# Patient Record
Sex: Female | Born: 2000
Health system: Southern US, Community
[De-identification: ages and names within clinical notes are randomized; demographics above are authoritative.]

## PROBLEM LIST (undated history)

## (undated) DIAGNOSIS — J45909 Unspecified asthma, uncomplicated: Secondary | ICD-10-CM

## (undated) DIAGNOSIS — F32A Depression, unspecified: Secondary | ICD-10-CM

## (undated) DIAGNOSIS — F419 Anxiety disorder, unspecified: Secondary | ICD-10-CM

## (undated) DIAGNOSIS — T7840XA Allergy, unspecified, initial encounter: Secondary | ICD-10-CM

## (undated) HISTORY — DX: Unspecified asthma, uncomplicated: J45.909

## (undated) HISTORY — DX: Depression, unspecified: F32.A

## (undated) HISTORY — DX: Anxiety disorder, unspecified: F41.9

## (undated) HISTORY — DX: Allergy, unspecified, initial encounter: T78.40XA

---
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
## 2019-12-01 ENCOUNTER — Other Ambulatory Visit: Payer: Self-pay

## 2019-12-01 ENCOUNTER — Inpatient Hospital Stay: Payer: No Typology Code available for payment source | Attending: Hematology and Oncology

## 2019-12-01 DIAGNOSIS — Z23 Encounter for immunization: Secondary | ICD-10-CM | POA: Diagnosis not present

## 2019-12-22 ENCOUNTER — Inpatient Hospital Stay: Payer: No Typology Code available for payment source

## 2019-12-25 ENCOUNTER — Other Ambulatory Visit: Payer: Self-pay

## 2019-12-25 ENCOUNTER — Inpatient Hospital Stay: Payer: No Typology Code available for payment source

## 2019-12-25 DIAGNOSIS — Z23 Encounter for immunization: Secondary | ICD-10-CM | POA: Diagnosis not present

## 2019-12-25 NOTE — Progress Notes (Signed)
   Covid-19 Vaccination Clinic  Name:  Curry Dulski    MRN: [id number] DOB: 22-Jan-2001  12/25/2019  Ms. Worrall was observed post Covid-19 immunization for 15 minutes without incident. She was provided with Vaccine Information Sheet and instruction to access the V-Safe system.   Ms. Hedger was instructed to call 911 with any severe reactions post vaccine: Marland Kitchen Difficulty breathing  . Swelling of face and throat  . A fast heartbeat  . A bad rash all over body  . Dizziness and weakness

## 2020-08-08 ENCOUNTER — Encounter: Payer: Self-pay | Admitting: Nurse Practitioner

## 2020-09-05 ENCOUNTER — Other Ambulatory Visit: Payer: Self-pay

## 2020-09-06 ENCOUNTER — Encounter: Payer: Self-pay | Admitting: Nurse Practitioner

## 2020-09-06 ENCOUNTER — Other Ambulatory Visit (INDEPENDENT_AMBULATORY_CARE_PROVIDER_SITE_OTHER): Payer: No Typology Code available for payment source

## 2020-09-06 ENCOUNTER — Ambulatory Visit (INDEPENDENT_AMBULATORY_CARE_PROVIDER_SITE_OTHER): Payer: No Typology Code available for payment source | Admitting: Nurse Practitioner

## 2020-09-06 VITALS — BP 110/60 | HR 79 | Ht 61.0 in | Wt 115.0 lb

## 2020-09-06 DIAGNOSIS — R1032 Left lower quadrant pain: Secondary | ICD-10-CM

## 2020-09-06 DIAGNOSIS — K5900 Constipation, unspecified: Secondary | ICD-10-CM

## 2020-09-06 DIAGNOSIS — R140 Abdominal distension (gaseous): Secondary | ICD-10-CM | POA: Diagnosis not present

## 2020-09-06 LAB — CBC WITH DIFFERENTIAL/PLATELET
Basophils Absolute: 0 10*3/uL (ref 0.0–0.1)
Basophils Relative: 0.4 % (ref 0.0–3.0)
Eosinophils Absolute: 0.2 10*3/uL (ref 0.0–0.7)
Eosinophils Relative: 2.6 % (ref 0.0–5.0)
HCT: 38.9 % (ref 36.0–46.0)
Hemoglobin: 12.7 g/dL (ref 12.0–15.0)
Lymphocytes Relative: 50.1 % — ABNORMAL HIGH (ref 12.0–46.0)
Lymphs Abs: 2.9 10*3/uL (ref 0.7–4.0)
MCHC: 32.7 g/dL (ref 30.0–36.0)
MCV: 83.4 fl (ref 78.0–100.0)
Monocytes Absolute: 0.4 10*3/uL (ref 0.1–1.0)
Monocytes Relative: 7.3 % (ref 3.0–12.0)
Neutro Abs: 2.3 10*3/uL (ref 1.4–7.7)
Neutrophils Relative %: 39.6 % — ABNORMAL LOW (ref 43.0–77.0)
Platelets: 331 10*3/uL (ref 150.0–400.0)
RBC: 4.66 Mil/uL (ref 3.87–5.11)
RDW: 13.5 % (ref 11.5–15.5)
WBC: 5.9 10*3/uL (ref 4.0–10.5)

## 2020-09-06 LAB — COMPREHENSIVE METABOLIC PANEL
ALT: 52 U/L — ABNORMAL HIGH (ref 0–35)
AST: 31 U/L (ref 0–37)
Albumin: 4.9 g/dL (ref 3.5–5.2)
Alkaline Phosphatase: 56 U/L (ref 39–117)
BUN: 9 mg/dL (ref 6–23)
CO2: 23 mEq/L (ref 19–32)
Calcium: 9.7 mg/dL (ref 8.4–10.5)
Chloride: 102 mEq/L (ref 96–112)
Creatinine, Ser: 0.67 mg/dL (ref 0.40–1.20)
GFR: 125.13 mL/min (ref >60.00)
Glucose, Bld: 73 mg/dL (ref 70–99)
Potassium: 4.1 mEq/L (ref 3.5–5.1)
Sodium: 137 mEq/L (ref 135–145)
Total Bilirubin: 0.9 mg/dL (ref 0.2–1.2)
Total Protein: 7.9 g/dL (ref 6.0–8.3)

## 2020-09-06 LAB — TSH: TSH: 2.51 u[IU]/mL (ref 0.35–5.50)

## 2020-09-06 LAB — C-REACTIVE PROTEIN: CRP: <1 mg/dL (ref 0.5–20.0)

## 2020-09-06 NOTE — Progress Notes (Signed)
Addendum: Reviewed and agree with assessment and management plan. Sarye Kath M, MD  

## 2020-09-06 NOTE — Progress Notes (Signed)
09/06/2020 Tara Davies [id number] [birth year]   CHIEF COMPLAINT: LLQ abdominal pain and constipation   HISTORY OF PRESENT ILLNESS: Tara Davies is a 20 year old female with a history of Covid 2020 with post infectious asthma, anxiety and depression. No past surgical history. She presents to our office today as referred by Quentin Mulling gyn NP for further evaluation for LLQ abdominal pain, constipation and abdominal bloat which initially started 6 months ago.  She is accompanied by her mother.  She complains of having constipation with frequent abdominal bloat. She describes having abdominal bloat after eating most foods, abdomen feels rock hard. No specific food triggers. She reported passing a large formed hard brown stool or clumps of stool once weekly. She eats small snack size meals throughout the day. She was seen by her gynecologist NP who ordered a pelvic sonogram which showed a trace amount of free fluid within the pelvis presumably physiologic otherwise was negative.  She was prescribed Miralax QD which resulted in passing more frequent formed stools. Her constipation significantly improved so she stopped taking Miralax one week ago and she continues to pass a normal BM daily. No rectal bleeding or black stool. Her LLQ pain has diminished but she continues to have generalized abdominal bloat discomfort despite having normal bowel movements for the past week.  She was previously on Lexapro for anxiety and depression which was changed to Zoloft 1 week ago.  No known family history of celiac disease or IBD.  Pelvic sonogram 05/25/2020: Uterus Measurements: 6.0 x 2.4 x 3.9 cm = volume: 29.7 mL. Uterus is  anteverted. No discrete fibroid or other mass.   Endometrium Thickness: 3.0 mm.  No focal abnormality visualized.   Right ovary Measurements: 2.5 x 2.0 x 2.3 cm = volume: 6.1 mL. Normal  appearance/no adnexal mass.   Left ovary Measurements: 2.2 x 1.6 x 1.9 cm = volume: 3.4 mL. Normal   appearance/no adnexal mass.   Other findings Trace free fluid within the pelvis, presumably physiologic  Social History: She is single.  She is a Theatre stage manager.  Smoker.  No alcohol use.  No drug use.  Family History: Mom 50 healthy. Father 38 hypertension. Sister age 1 with anxiety. Maternal grandmother with IBS.    Outpatient Encounter Medications as of 09/06/2020  Medication Sig   cetirizine (ZYRTEC) 10 MG tablet Take by mouth.   escitalopram (LEXAPRO) 10 MG tablet Take 1 tablet by mouth daily.   LO LOESTRIN FE 1 MG-10 MCG / 10 MCG tablet Take 1 tablet by mouth daily.   sertraline (ZOLOFT) 50 MG tablet Take 50 mg by mouth daily.   No facility-administered encounter medications on file as of 09/06/2020.    REVIEW OF SYSTEMS:  Gen: Denies fever, sweats or chills. No weight loss.  CV: Denies chest pain, palpitations or edema. Resp: Denies cough, shortness of breath of hemoptysis.  GI: See HPI. GU : Denies urinary burning, blood in urine, increased urinary frequency or incontinence. MS: Denies joint pain, muscles aches or weakness. Derm: Denies rash, itchiness, skin lesions or unhealing ulcers. Psych: See HPI.  Heme: Denies bruising, bleeding. Neuro:  Denies headaches, dizziness or paresthesias. Endo:  Denies any problems with DM, thyroid or adrenal function.  PHYSICAL EXAM: BP 110/60   Pulse 79   Ht 5\' 1"  (1.549 m)   Wt 115 lb (52.2 kg)   BMI 21.73 kg/m   General: 20 year old female in no acute distress. Head: Normocephalic and atraumatic. Eyes:  Sclerae non-icteric, conjunctive pink. Ears: Normal auditory acuity. Mouth: Dentition intact. No ulcers or lesions.  Neck: Supple, no lymphadenopathy or thyromegaly.  Lungs: Clear bilaterally to auscultation without wheezes, crackles or rhonchi. Heart: Regular rate and rhythm. No murmur, rub or gallop appreciated.  Abdomen: Soft, non distended. Mild epigastric, RUQ, periumbilical and LLQ tenderness without rebound or guarding.  No masses. No hepatosplenomegaly. Normoactive bowel sounds x 4 quadrants.  Rectal: Deferred. Musculoskeletal: Symmetrical with no gross deformities. Skin: Warm and dry. No rash or lesions on visible extremities. Extremities: No edema. Neurological: Alert oriented x 4, no focal deficits.  Psychological:  Alert and cooperative. Normal mood and affect.  ASSESSMENT AND PLAN:  20 year old female with LLQ pain, constipation and abdominal bloat x 6 months. Constipation resolved after taking Miralax QD and after Lexapro was switched to Zoloft. Abdominal bloat discomfort persists despite resolution of constipation.  -CBC, CMP, TSH, CRP, TTG, IgA and food allergy panel -MiraLAX nightly as needed -Florastor probiotic 1 p.o. twice daily for 2 to 4-week -May add bacteria probiotic daily if needed in 1 week -IBgard 1 p.o. twice daily -Consider CTAP if her abdominal pains worsen -SIBO breath test if the above lab results unrevealing  -Avoid dairy products -Patient to follow-up in the office in 6-8 weeks       CC:  No ref. provider found

## 2020-09-06 NOTE — Patient Instructions (Addendum)
LABS:  Lab work has been ordered for you today. Our lab is located in the basement. Press "B" on the elevator. The lab is located at the first door on the left as you exit the elevator.  HEALTHCARE LAWS AND MY CHART RESULTS: Due to recent changes in healthcare laws, you may see the results of your imaging and laboratory studies on MyChart before your provider has had a chance to review them.   We understand that in some cases there may be results that are confusing or concerning to you. Not all laboratory results come back in the same time frame and the provider may be waiting for multiple results in order to interpret others.  Please give [place] 48 hours in order for your provider to thoroughly review all the results before contacting the office for clarification of your results.   RECOMMENDATIONS: Miralax- Dissolve one capful in 8 ounces of water and drink before bed. Ok to take daily if needed. Florastor Probiotic, take 1 twice a day. May add Phillips bacteria probiotic once a day. Avoid daily. IBGARD take 1 twice a day as needed for abdominal bloat. Follow up in 6-8 weeks.  It was great seeing you today! Thank you for entrusting me with your care and choosing [facility].  [name], CRNP  The [facility] providers would like to encourage you to use [facility] to communicate with providers for non-urgent requests or questions.  Due to long hold times on the telephone, sending your provider a message by [facility] may be faster and more efficient way to get a response. Please allow 48 business hours for a response.  Please remember that this is for non-urgent requests/questions.  If you are age 63 or older, your body mass index should be between 23-30. Your Body mass index is 21.73 kg/m. If this is out of the aforementioned range listed, please consider follow up with your Primary Care Provider.  If you are age 30 or younger, your body mass index should be between 19-25.  Your Body mass index is 21.73 kg/m. If this is out of the aformentioned range listed, please consider follow up with your Primary Care Provider.

## 2020-09-07 LAB — INTERPRETATION:

## 2020-09-07 LAB — IGA: Immunoglobulin A: 131 mg/dL (ref 47–310)

## 2020-09-07 LAB — FOOD ALLERGY PROFILE
Allergen, Salmon, f41: <0.1 kU/L
Almonds: 0.16 kU/L — ABNORMAL HIGH
CLASS: 0
CLASS: 0
CLASS: 0
CLASS: 0
CLASS: 0
CLASS: 0
CLASS: 0
CLASS: 0
CLASS: 0
CLASS: 2
Cashew IgE: <0.1 kU/L
Class: 0
Class: 0
Class: 0
Egg White IgE: <0.1 kU/L
Fish Cod: <0.1 kU/L
Hazelnut: 1.47 kU/L — ABNORMAL HIGH
Milk IgE: <0.1 kU/L
Peanut IgE: <0.1 kU/L
Scallop IgE: <0.1 kU/L
Sesame Seed f10: <0.1 kU/L
Shrimp IgE: 0.15 kU/L — ABNORMAL HIGH
Soybean IgE: <0.1 kU/L
Tuna IgE: <0.1 kU/L
Walnut: <0.1 kU/L
Wheat IgE: <0.1 kU/L

## 2020-09-07 LAB — TISSUE TRANSGLUTAMINASE ABS,IGG,IGA
(tTG) Ab, IgA: <1 U/mL
(tTG) Ab, IgG: <1 U/mL

## 2020-09-07 NOTE — Telephone Encounter (Signed)
I called the patient with her lab results and discussed her ALT level was mildly elevated.  We will recheck hepatic panel in 2 weeks.  If her LFTs remain elevated she will require hepatitis A, B and C serologies.  Her food allergy panel showed IgE elevation response to shrimp, almond and hazelnut.  She is interested in seeing an allergist for further food allergy testing.  Lauren, please enter a hepatic panel order for the patient to complete in 2 weeks.  She is aware to present back to our lab to have done.  Please enter an allergist referral to further evaluate for food allergies thank you

## 2020-09-08 ENCOUNTER — Other Ambulatory Visit: Payer: Self-pay

## 2020-09-08 DIAGNOSIS — R748 Abnormal levels of other serum enzymes: Secondary | ICD-10-CM

## 2020-09-21 ENCOUNTER — Other Ambulatory Visit (INDEPENDENT_AMBULATORY_CARE_PROVIDER_SITE_OTHER): Payer: No Typology Code available for payment source

## 2020-09-21 DIAGNOSIS — R748 Abnormal levels of other serum enzymes: Secondary | ICD-10-CM

## 2020-09-21 LAB — HEPATIC FUNCTION PANEL
ALT: 53 U/L — ABNORMAL HIGH (ref 0–35)
AST: 33 U/L (ref 0–37)
Albumin: 4.6 g/dL (ref 3.5–5.2)
Alkaline Phosphatase: 56 U/L (ref 39–117)
Bilirubin, Direct: 0.1 mg/dL (ref 0.0–0.3)
Total Bilirubin: 0.6 mg/dL (ref 0.2–1.2)
Total Protein: 7.7 g/dL (ref 6.0–8.3)

## 2020-09-22 ENCOUNTER — Other Ambulatory Visit: Payer: Self-pay | Admitting: *Deleted

## 2020-09-22 DIAGNOSIS — R748 Abnormal levels of other serum enzymes: Secondary | ICD-10-CM

## 2020-09-22 NOTE — Progress Notes (Signed)
Pt labs labs placed in Epic

## 2020-09-29 ENCOUNTER — Ambulatory Visit (INDEPENDENT_AMBULATORY_CARE_PROVIDER_SITE_OTHER): Payer: No Typology Code available for payment source | Admitting: Allergy & Immunology

## 2020-09-29 ENCOUNTER — Other Ambulatory Visit: Payer: Self-pay

## 2020-09-29 VITALS — BP 108/60 | HR 96 | Temp 98.8°F | Resp 18 | Ht 61.0 in | Wt 114.4 lb

## 2020-09-29 DIAGNOSIS — K9049 Malabsorption due to intolerance, not elsewhere classified: Secondary | ICD-10-CM | POA: Diagnosis not present

## 2020-09-29 DIAGNOSIS — J4520 Mild intermittent asthma, uncomplicated: Secondary | ICD-10-CM | POA: Diagnosis not present

## 2020-09-29 DIAGNOSIS — L508 Other urticaria: Secondary | ICD-10-CM

## 2020-09-29 NOTE — Progress Notes (Signed)
NEW PATIENT  Date of Service/Encounter:  09/29/20  Consult requested by: [name], PA-C   Assessment:   Food intolerance - with multiple equivocal reactivities with unclear clinical significance  Chronic urticaria  Allergic asthma - well controlled (did not do environmental allergy testing today)  Plan/Recommendations:   1. Food intolerance - Testing was only slightly reactive to a few things: cod, carrot, black pepper, lamb, and casein (part of milk) - Copy of test results provided. - None of these are large enough for me to be super concerned with, but it does give [place] a starting point. - I would remove these things from your diet as well as wheat. - If you are not feeling any better, slowly add them back into your diet. - At least we do not have to worry about an epinephrine autoinjector. - I think you should continue to follow with GI.  2. Chronic urticaria - Your history does not have any "red flags" such as fevers, joint pains, or permanent skin changes that would be concerning for a more serious cause of hives.  - We will get some labs to rule out serious causes of hives: complete blood count, tryptase level, chronic urticaria panel, CMP, ESR, and CRP. - We are going to follow up on those liver function tests.  - Chronic hives are often times a self limited process and will "burn themselves out" over 6-12 months, although this is not always the case.  - In the meantime, start suppressive dosing of antihistamines:   - Morning: Allegra (fexofenadine) 154m + Pepcid 462m - Evening: Allegra (fexofenadine) 18071m Pepcid 38m10m. Return in about 3 months (around 12/29/2020).    This note in its entirety was forwarded to the Provider who requested this consultation.  Subjective:   Tara Davies 20 y54. female presenting today for evaluation of  Chief Complaint  Patient presents with   Urticaria    States she is breaking out a lot after consuming  foods. Thought is was Gluten allergy. She lost eight pounds after a week of not consumes gluten foods.   Rash    Was on her abdomen and began to spread. She states she has pictures.     Tara Davies a history of the following: Patient Active Problem List   Diagnosis Date Noted   Mild intermittent asthma, uncomplicated 09/001/09/3235hronic urticaria 10/03/2020   Food intolerance 10/03/2020   Constipation 09/06/2020   Abdominal bloating 09/06/2020   Adolescent idiopathic scoliosis of thoracolumbar region 10/22/2014    History obtained from: chart review and patient and mother.  Tara Davies referred by SkilRosalee Kaufman-C.     Tara Davies 20 y52. female presenting for a follow up visit. She has breakouts very often. This has been going on since January. She has pictures that appear urticarial. They are over her arms and legs and entire body. Hives have not gone away since stopping the tree nuts, gluten and shrimp. She takes Benadryl only when she breaks out. She has tried taking longer acting antihistamines to see if this helps.   She did have some bloating and diarrhea around the same time that the rashes started. She has not been able to correlate this with any particular food. She has never been allergy tested.   She had a food panel that demonstrated IgE to shrimp, hazelnut, and almonds. She also stopped gluten and lost 8 pounds in a week. Se  did stop the shrimp since it was present as well. Her bloating has improved with avoidance of the gluten. They did a colonoscopy and endoscopy and a lot of labs. She started seeing GI three weeks ago. She has been trying to get in since April. Alpha gal was tested and this was negative.   She had a history of elevated LFTs. She started at [facility] last November and her LFTs were normal at that time. She has a follow up on October 3rd. Her ALT was 52 three weeks ago. She has milk thistle supplements that she is taking to help with the  elevated LFTs. She is also on IBGard (peppermint) to help with this.    Asthma/Respiratory Symptom History: She has seasonal asthma. She did have it in October 2020.  She has albuterol as needed. She has not been on a controller medication at all. She has not needed prednisone for her breathing.   Allergic Rhinitis Symptom History: She has itchy watery eyes and runny nose. She does have issues with cats. She was never on shots.   Otherwise, there is no history of other atopic diseases, including drug allergies, stinging insect allergies, eczema, urticaria, or contact dermatitis. There is no significant infectious history. Vaccinations are up to date.    Past Medical History: Patient Active Problem List   Diagnosis Date Noted   Mild intermittent asthma, uncomplicated 62/13/0865   Chronic urticaria 10/03/2020   Food intolerance 10/03/2020   Constipation 09/06/2020   Abdominal bloating 09/06/2020   Adolescent idiopathic scoliosis of thoracolumbar region 10/22/2014    Medication List:  Allergies as of 09/29/2020   No Known Allergies      Medication List        Accurate as of September 29, 2020 11:59 PM. If you have any questions, ask your nurse or doctor.          STOP taking these medications    polyethylene glycol 17 g packet Commonly known as: MIRALAX / GLYCOLAX Stopped by: Valentina Shaggy, MD       TAKE these medications    cetirizine 10 MG tablet Commonly known as: ZYRTEC Take by mouth.   fluticasone 50 MCG/ACT nasal spray Commonly known as: FLONASE Place 1 spray into both nostrils daily as needed for allergies or rhinitis (Allergic rhinitis).   IBGARD PO Take by mouth 2 (two) times daily.   Lo Loestrin Fe 1 MG-10 MCG / 10 MCG tablet Generic drug: Norethindrone-Ethinyl Estradiol-Fe Biphas Take 1 tablet by mouth daily.   milk thistle 175 MG tablet Take 175 mg by mouth daily.   Probiotic Daily Caps Take by mouth.   sertraline 50 MG tablet Commonly  known as: ZOLOFT Take 50 mg by mouth daily.        Birth History: non-contributory  Developmental History: non-contributory  Past Surgical History: No past surgical history on file.   Family History: Family History  Problem Relation Age of Onset   Diabetes Maternal Grandmother    Diabetes Paternal Grandfather      Social History: Tara Davies lives at home with her family.  They live in a house that is [address] years old.  There are hardwood floors throughout the home.  They have carpeting in the bedrooms.  They have electric heating and central cooling.  There is 1 dog inside of the home.  There are no dust mite covers on the bedding.  There is no tobacco exposure.  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  She is not exposed to fumes, chemicals, or dust.  She does not have a HEPA filter.  She does not live near an interstate or industrial area.   Review of Systems  Constitutional: Negative.  Negative for fever, malaise/fatigue and weight loss.  HENT: Negative.  Negative for congestion, ear discharge and ear pain.   Eyes:  Negative for pain, discharge and redness.  Respiratory:  Negative for cough, sputum production, shortness of breath and wheezing.   Cardiovascular: Negative.  Negative for chest pain and palpitations.  Gastrointestinal:  Positive for abdominal pain, diarrhea and nausea. Negative for heartburn.  Skin: Negative.  Negative for itching and rash.  Neurological:  Negative for dizziness and headaches.  Endo/Heme/Allergies:  Negative for environmental allergies. Does not bruise/bleed easily.  All other systems reviewed and are negative.     Objective:   Blood pressure 108/60, pulse 96, temperature 98.8 F (37.1 C), temperature source Temporal, resp. rate 18, height 5' 1"  (1.549 m), weight 114 lb 6.4 oz (51.9 kg), SpO2 98 %. Body mass index is 21.62 kg/m.   Physical Exam:   Physical Exam Constitutional:      Appearance: She is well-developed.  HENT:      Head: Normocephalic and atraumatic.     Right Ear: Tympanic membrane, ear canal and external ear normal. No drainage, swelling or tenderness. Tympanic membrane is not injected, scarred, erythematous, retracted or bulging.     Left Ear: Tympanic membrane, ear canal and external ear normal. No drainage, swelling or tenderness. Tympanic membrane is not injected, scarred, erythematous, retracted or bulging.     Nose: No nasal deformity, septal deviation, mucosal edema or rhinorrhea.     Right Turbinates: Swollen. Not pale.     Left Turbinates: Swollen. Not pale.     Right Sinus: No maxillary sinus tenderness or frontal sinus tenderness.     Left Sinus: No maxillary sinus tenderness or frontal sinus tenderness.     Mouth/Throat:     Mouth: Mucous membranes are not pale and not dry.     Pharynx: Uvula midline.  Eyes:     General:        Right eye: No discharge.        Left eye: No discharge.     Conjunctiva/sclera: Conjunctivae normal.     Right eye: Right conjunctiva is not injected. No chemosis.    Left eye: Left conjunctiva is not injected. No chemosis.    Pupils: Pupils are equal, round, and reactive to light.  Cardiovascular:     Rate and Rhythm: Normal rate and regular rhythm.     Heart sounds: Normal heart sounds.  Pulmonary:     Effort: Pulmonary effort is normal. No tachypnea, accessory muscle usage or respiratory distress.     Breath sounds: Normal breath sounds. No wheezing, rhonchi or rales.  Chest:     Chest wall: No tenderness.  Abdominal:     Tenderness: There is no abdominal tenderness. There is no guarding or rebound.  Lymphadenopathy:     Head:     Right side of head: No submandibular, tonsillar or occipital adenopathy.     Left side of head: No submandibular, tonsillar or occipital adenopathy.     Cervical: No cervical adenopathy.  Skin:    Coloration: Skin is not pale.     Findings: No abrasion, erythema, petechiae or rash. Rash is not papular, urticarial or  vesicular.  Neurological:     Mental Status: She is alert.     Diagnostic  studies:    Allergy Studies:   Complete food panel: slight reactivities to casein, codfish, lamb, carrots, and black pepper with adequate controls.         Salvatore Marvel, MD [facility]

## 2020-09-29 NOTE — Patient Instructions (Addendum)
1. Food intolerance - Testing was only slightly reactive to a few things: cod, carrot, black pepper, lamb, and casein (part of milk) - Copy of test results provided. - None of these are large enough for me to be super concerned with, but it does give [place] a starting point. - I would remove these things from your diet as well as wheat. - If you are not feeling any better, slowly add them back into your diet. - At least we do not have to worry about an epinephrine autoinjector. - I think he should continue to follow with GI.  2. Chronic urticaria - Your history does not have any "red flags" such as fevers, joint pains, or permanent skin changes that would be concerning for a more serious cause of hives.  - We will get some labs to rule out serious causes of hives: complete blood count, tryptase level, chronic urticaria panel, CMP, ESR, and CRP. - We are going to follow up on those liver function tests.  - Chronic hives are often times a self limited process and will "burn themselves out" over 6-12 months, although this is not always the case.  - In the meantime, start suppressive dosing of antihistamines:   - Morning: Allegra (fexofenadine) 174m + Pepcid 453m - Evening: Allegra (fexofenadine) 18048m Pepcid 55m15m. Return in about 3 months (around 12/29/2020).    Please inform us oKoreaany Emergency Department visits, hospitalizations, or changes in symptoms. Call us bKoreaore going to the ED for breathing or allergy symptoms since we might be able to fit you in for a sick visit. Feel free to contact us aKoreatime with any questions, problems, or concerns.  It was a pleasure to meet you and your mother today!  Websites that have reliable patient information: 1. American Academy of Asthma, Allergy, and Immunology: www.aaaai.org 2. Food Allergy Research and Education (FARE): foodallergy.org 3. Mothers of Asthmatics: http://www.asthmacommunitynetwork.org 4. American College of Allergy, Asthma, and  Immunology: www.acaai.org   COVID-19 Vaccine Information can be found at: [email or web address] questions related to vaccine distribution or appointments, please email [email or web address] or call [phone] realize that you might be concerned about having an allergic reaction to the COVID19 vaccines. To help with that concern, WE ARE OFFERING THE COVID19 VACCINES IN OUR OFFICE! Ask the front desk for dates!     "Like" us oKoreaFacebook and Instagram for our latest updates!      A healthy democracy works best when ALL [place] [employer]ate! Make sure you are registered to vote! If you have moved or changed any of your contact information, you will need to get this updated before voting!  In some cases, you MAY be able to register to vote online: [email or web address]

## 2020-10-03 ENCOUNTER — Encounter: Payer: Self-pay | Admitting: Allergy & Immunology

## 2020-10-03 ENCOUNTER — Telehealth: Payer: Self-pay | Admitting: Nurse Practitioner

## 2020-10-03 DIAGNOSIS — J4520 Mild intermittent asthma, uncomplicated: Secondary | ICD-10-CM | POA: Insufficient documentation

## 2020-10-03 DIAGNOSIS — L508 Other urticaria: Secondary | ICD-10-CM | POA: Insufficient documentation

## 2020-10-03 DIAGNOSIS — R748 Abnormal levels of other serum enzymes: Secondary | ICD-10-CM

## 2020-10-03 DIAGNOSIS — K9049 Malabsorption due to intolerance, not elsewhere classified: Secondary | ICD-10-CM | POA: Insufficient documentation

## 2020-10-03 NOTE — Telephone Encounter (Signed)
Tara Davies, I received a staff msg from Tara Davies who saw the Tara Davies at school and the Tara Davies reported she has not heard back from our office regarding her lab results and questioned the next step and follow up.    Refer to labs done 09/21/2020 with lab notes dated as follows:   Spoke with Tara Davies mother. Results reviewed. Pts mother stated Tara Davies has appt planned for allergist at the is at the beginning of September. Tara Davies scduled with Ula Lingo Smith,NP as she has first available and Tara Davies mother wanted an appt as soon as she could have on. Tara Davies scheduled for 10/31/20 at 8:30a. Tara Davies mother verbalized understanding of discussed and nothing further needed at this time,   [name], NP  09/22/2020  3:16 PM EDT     [name], mother is on HIPPA form so ok to call her, let her know Valita was checked for celiac disease by blood test 09/06/2020 (TTG, IGA) and the results were negative. A food allergy panel showed allergic response to hazelnut, shrimp and almonds and my recommendations was for [name] to see an allergist for further testing which can also include checking for gluten sensitivity. If Tara Davies has further concerns about being tested for celiac disease then we can discuss an EGD to biopsy the duodenum (which is the most accurate way to diagnose celiac dz) at the time of her follow up appointment.  THX   Erenest Rasher, RN  09/22/2020 10:18 AM EDT     Spoke with Tara Davies mother. Mother made aware that Tara Davies needs to come in for labes to be drawn. Verbalized understanding.   Tara Davies mother states has been having a rash on buttocks, her temple are, and sides of her body. Mother states she has noticed when Tara Davies eats gluten free foods no rash appears. Mother is wondering if Gluten is related to the rashes forming.Please advice, thank you   Lab ORders placed in Epic    Bishop, NP  09/22/2020  7:02 AM EDT     [name], pls contact [name] and let her know her ALT  liver enzyme test remains mildly elevated. Pls send her to the lab to have additional labs done as follows: Hepatitis A total antibody, Hepatitis B surface antigen, Hepatitis B surface antibody, Hepatitis B core total antibody, Hepatitis C antibody, Alpha 1 antitrypsin level, ceruloplasmin level, ANA, smooth muscle antibody (SMA), antimitochondrial antibody (AMA), iron and ferritin level.    Send her for a RUQ sonogram to evaluate the liver which can be scheduled at her convenience    Schedule an office follow up appt in 6 to 8 weeks if not already done thx     Tara Davies, sounds like mother and daughter are not effectively communicating.   1) Please call the Tara Davies and discuss all of the prior lab messages from 09/22/2020 as pasted above. Let her know you discussed the above with her mother who verified she would convey this info to the Tara Davies. She was seen by an allergist as I requested and I have reviewed that consult note.   2) Did you order the RUQ sonogram per my request above? If not, please contact Tara Davies and schedule a RUQ sonogram due to elevated LFTs.   3) Remind Tara Davies she has an office follow up appointment with me on 10/31/2020 on 8:30AM thx

## 2020-10-04 NOTE — Telephone Encounter (Signed)
Spoke with patients mother. Pts mother made aware that patient needs RUQ sonogram. Sonogram has been scheduled for Friday 10am at [facility]. Pts mother made aware that is to not eat after midnight. Mother verbalized understanding and at this time nothing further needed.

## 2020-10-05 LAB — ALPHA-GAL PANEL
Allergen Lamb IgE: <0.1 kU/L
Beef IgE: <0.1 kU/L
IgE (Immunoglobulin E), Serum: 191 IU/mL (ref 6–495)
O215-IgE Alpha-Gal: <0.1 kU/L
Pork IgE: <0.1 kU/L

## 2020-10-07 ENCOUNTER — Ambulatory Visit: Payer: No Typology Code available for payment source | Admitting: Internal Medicine

## 2020-10-07 ENCOUNTER — Ambulatory Visit (HOSPITAL_COMMUNITY)
Admission: RE | Admit: 2020-10-07 | Discharge: 2020-10-07 | Disposition: A | Discharge status: Home / Self Care | Payer: No Typology Code available for payment source | Source: Ambulatory Visit | Attending: Nurse Practitioner | Admitting: Nurse Practitioner

## 2020-10-07 ENCOUNTER — Other Ambulatory Visit: Payer: Self-pay

## 2020-10-07 DIAGNOSIS — R748 Abnormal levels of other serum enzymes: Secondary | ICD-10-CM | POA: Insufficient documentation

## 2020-10-12 LAB — SEDIMENTATION RATE: Sed Rate: 2 mm/hr (ref 0–32)

## 2020-10-12 LAB — CMP14+EGFR
ALT: 137 IU/L — ABNORMAL HIGH (ref 0–32)
AST: 71 IU/L — ABNORMAL HIGH (ref 0–40)
Albumin/Globulin Ratio: 1.9 (ref 1.2–2.2)
Albumin: 5 g/dL (ref 3.9–5.0)
Alkaline Phosphatase: 76 IU/L (ref 44–121)
BUN/Creatinine Ratio: 16 (ref 9–23)
BUN: 11 mg/dL (ref 6–20)
Bilirubin Total: 0.6 mg/dL (ref 0.0–1.2)
CO2: 23 mmol/L (ref 20–29)
Calcium: 10.5 mg/dL — ABNORMAL HIGH (ref 8.7–10.2)
Chloride: 101 mmol/L (ref 96–106)
Creatinine, Ser: 0.7 mg/dL (ref 0.57–1.00)
Globulin, Total: 2.7 g/dL (ref 1.5–4.5)
Glucose: 83 mg/dL (ref 65–99)
Potassium: 4.1 mmol/L (ref 3.5–5.2)
Sodium: 143 mmol/L (ref 134–144)
Total Protein: 7.7 g/dL (ref 6.0–8.5)
eGFR: 126 mL/min/{1.73_m2} (ref >59)

## 2020-10-12 LAB — TRYPTASE: Tryptase: 2 ug/L — ABNORMAL LOW (ref 2.2–13.2)

## 2020-10-12 LAB — CHRONIC URTICARIA: cu index: <1.2 (ref <10)

## 2020-10-12 LAB — ANTINUCLEAR ANTIBODIES, IFA: ANA Titer 1: NEGATIVE

## 2020-10-12 LAB — C-REACTIVE PROTEIN: CRP: <1 mg/L (ref 0–10)

## 2020-10-12 LAB — THYROID ANTIBODIES
Thyroglobulin Antibody: <1 IU/mL (ref 0.0–0.9)
Thyroperoxidase Ab SerPl-aCnc: <8 IU/mL (ref 0–34)

## 2020-10-17 ENCOUNTER — Other Ambulatory Visit (INDEPENDENT_AMBULATORY_CARE_PROVIDER_SITE_OTHER): Payer: No Typology Code available for payment source

## 2020-10-17 DIAGNOSIS — R748 Abnormal levels of other serum enzymes: Secondary | ICD-10-CM

## 2020-10-17 LAB — IBC + FERRITIN
Ferritin: 26 ng/mL (ref 10.0–291.0)
Iron: 98 ug/dL (ref 42–145)
Saturation Ratios: 17.5 % — ABNORMAL LOW (ref 20.0–50.0)
TIBC: 561.4 ug/dL — ABNORMAL HIGH (ref 250.0–450.0)
Transferrin: 401 mg/dL — ABNORMAL HIGH (ref 212.0–360.0)

## 2020-10-20 LAB — HEPATITIS B CORE ANTIBODY, TOTAL: Hep B Core Total Ab: NONREACTIVE

## 2020-10-20 LAB — MITOCHONDRIAL ANTIBODIES: Mitochondrial M2 Ab, IgG: <=20 U (ref <=20.0)

## 2020-10-20 LAB — HEPATITIS B SURFACE ANTIGEN: Hepatitis B Surface Ag: NONREACTIVE

## 2020-10-20 LAB — ANTI-SMOOTH MUSCLE ANTIBODY, IGG: Actin (Smooth Muscle) Antibody (IGG): <20 U (ref <20)

## 2020-10-20 LAB — HEPATITIS A ANTIBODY, TOTAL: Hepatitis A AB,Total: NONREACTIVE

## 2020-10-20 LAB — HEPATITIS C ANTIBODY
Hepatitis C Ab: NONREACTIVE
SIGNAL TO CUT-OFF: 0.03 (ref <1.00)

## 2020-10-20 LAB — ALPHA-1-ANTITRYPSIN: A-1 Antitrypsin, Ser: 162 mg/dL (ref 83–199)

## 2020-10-20 LAB — ANA: Anti Nuclear Antibody (ANA): NEGATIVE

## 2020-10-20 LAB — CERULOPLASMIN: Ceruloplasmin: 32 mg/dL (ref 18–53)

## 2020-10-21 ENCOUNTER — Other Ambulatory Visit: Payer: Self-pay

## 2020-10-21 DIAGNOSIS — R748 Abnormal levels of other serum enzymes: Secondary | ICD-10-CM

## 2020-10-25 ENCOUNTER — Ambulatory Visit: Payer: No Typology Code available for payment source | Admitting: Allergy

## 2020-10-31 ENCOUNTER — Ambulatory Visit: Payer: No Typology Code available for payment source | Admitting: Nurse Practitioner

## 2020-10-31 NOTE — Progress Notes (Deleted)
     10/31/2020 Tara Davies [id number] December 08, 2000   Chief Complaint: Elevated LFTs  History of Present Illness: Tara Davies is a 20 year old female with a history of Covid 2020 with post infectious asthma, chronic urticaria, anxiety and depression. No past surgical history  Food allergy panel Testing was only slightly reactive to a few things: cod, carrot, black pepper, lamb, and casein (part of milk). Alphagal negative. CRP < 1  She has milk thistle supplements that she is taking to help with the elevated LFTs. She is also on IBGard (peppermint) for abdominal bloat   Hepatic panel, IgG, Hep B surface antibody ordered not done.   ANA negative. CRP < 1.   Hepatic Function Latest Ref Rng & Units 09/29/2020 09/21/2020 09/06/2020  Total Protein 6.0 - 8.5 g/dL 7.7 7.7 7.9  Albumin 3.9 - 5.0 g/dL 5.0 4.6 4.9  AST 0 - 40 IU/L 71(H) 33 31  ALT 0 - 32 IU/L 137(H) 53(H) 52(H)  Alk Phosphatase 44 - 121 IU/L 76 56 56  Total Bilirubin 0.0 - 1.2 mg/dL 0.6 0.6 0.9  Bilirubin, Direct 0.0 - 0.3 mg/dL - 0.1 -     RUQ sonogram 10/07/2020: 1. Normal sonographic appearance of the gallbladder. No biliary dilatation. 2. Dense echogenic echotexture of the liver, suggesting steatosis.      Current Medications, Allergies, Past Medical History, Past Surgical History, Family History and Social History were reviewed in [employer] record.   Review of Systems:   Constitutional: Negative for fever, sweats, chills or weight loss.  Respiratory: Negative for shortness of breath.   Cardiovascular: Negative for chest pain, palpitations and leg swelling.  Gastrointestinal: See HPI.  Musculoskeletal: Negative for back pain or muscle aches.  Neurological: Negative for dizziness, headaches or paresthesias.    Physical Exam: There were no vitals taken for this visit. General: Well developed, w   ***female in no acute distress. Head: Normocephalic and atraumatic. Eyes: No scleral icterus.  Conjunctiva pink . Ears: Normal auditory acuity. Mouth: Dentition intact. No ulcers or lesions.  Lungs: Clear throughout to auscultation. Heart: Regular rate and rhythm, no murmur. Abdomen: Soft, nontender and nondistended. No masses or hepatomegaly. Normal bowel sounds x 4 quadrants.  Rectal: *** Musculoskeletal: Symmetrical with no gross deformities. Extremities: No edema. Neurological: Alert oriented x 4. No focal deficits.  Psychological: Alert and cooperative. Normal mood and affect  Assessment and Recommendations: ***

## 2020-11-21 ENCOUNTER — Ambulatory Visit: Payer: No Typology Code available for payment source | Admitting: Allergy

## 2020-12-09 ENCOUNTER — Ambulatory Visit: Payer: No Typology Code available for payment source | Admitting: Nurse Practitioner

## 2020-12-09 NOTE — Progress Notes (Deleted)
12/09/2020 Tara Davies [id number] 01-Jun-2000   Chief Complaint:  Constipation, elevated LFTs  History of Present Illness:  Tara Davies is a 20 year old female with a history of Covid 2020 with post infectious asthma, anxiety and depression. No past surgical history.   enter a new  lab order  for a hepatic panel, Hep B surface antibody level, GGT and IgG level to be done a few days prior to her follow up appt. Ordered 9/23 but not done.    TTG, IGA) and the results were negative. A food allergy panel showed allergic response to hazelnut, shrimp and almonds and my recommendations was for Hager to see an allergist for further testing which can also include checking for gluten sensitivity  Alpha Gal negative.   Labs 10/17/2020:   RUQ sonogram: EXAM: ULTRASOUND ABDOMEN LIMITED RIGHT UPPER QUADRANT   COMPARISON:  None.   FINDINGS: Gallbladder:   No gallstones or wall thickening visualized. No sonographic Murphy sign noted by sonographer.   Common bile duct:Diameter: 2.5 mm   Liver: No focal lesion identified. Liver demonstrates a dense echogenic echotexture. Portal vein is patent on color Doppler imaging with normal direction of blood flow towards the liver.   IMPRESSION: 1. Normal sonographic appearance of the gallbladder. No biliary dilatation. 2. Dense echogenic echotexture of the liver, suggesting steatosis.     CBC Latest Ref Rng & Units 09/06/2020  WBC 4.0 - 10.5 K/uL 5.9  Hemoglobin 12.0 - 15.0 g/dL 12.7  Hematocrit 36.0 - 46.0 % 38.9  Platelets 150.0 - 400.0 K/uL 331.0    CMP Latest Ref Rng & Units 09/29/2020 09/21/2020 09/06/2020  Glucose 65 - 99 mg/dL 83 - 73  BUN 6 - 20 mg/dL 11 - 9  Creatinine 0.57 - 1.00 mg/dL 0.70 - 0.67  Sodium 134 - 144 mmol/L 143 - 137  Potassium 3.5 - 5.2 mmol/L 4.1 - 4.1  Chloride 96 - 106 mmol/L 101 - 102  CO2 20 - 29 mmol/L 23 - 23  Calcium 8.7 - 10.2 mg/dL 10.5(H) - 9.7  Total Protein 6.0 - 8.5 g/dL 7.7 7.7 7.9  Total  Bilirubin 0.0 - 1.2 mg/dL 0.6 0.6 0.9  Alkaline Phos 44 - 121 IU/L 76 56 56  AST 0 - 40 IU/L 71(H) 33 31  ALT 0 - 32 IU/L 137(H) 53(H) 54(H)      20 year old female with LLQ pain, constipation and abdominal bloat x 6 months. Constipation resolved after taking Miralax QD and after Lexapro was switched to Zoloft. Abdominal bloat discomfort persists despite resolution of constipation.  -CBC, CMP, TSH, CRP, TTG, IgA and food allergy panel -MiraLAX nightly as needed -Florastor probiotic 1 p.o. twice daily for 2 to 4-week -May add bacteria probiotic daily if needed in 1 week -IBgard 1 p.o. twice daily -Consider CTAP if her abdominal pains worsen -SIBO breath test if the above lab results unrevealing  -Avoid dairy products -Patient to follow-up in the office in 6-8 weeks  Current Medications, Allergies, Past Medical History, Past Surgical History, Family History and Social History were reviewed in [employer] record.   Review of Systems:   Constitutional: Negative for fever, sweats, chills or weight loss.  Respiratory: Negative for shortness of breath.   Cardiovascular: Negative for chest pain, palpitations and leg swelling.  Gastrointestinal: See HPI.  Musculoskeletal: Negative for back pain or muscle aches.  Neurological: Negative for dizziness, headaches or paresthesias.    Physical Exam: There were no vitals taken  for this visit. General: Well developed, w   ***female in no acute distress. Head: Normocephalic and atraumatic. Eyes: No scleral icterus. Conjunctiva pink . Ears: Normal auditory acuity. Mouth: Dentition intact. No ulcers or lesions.  Lungs: Clear throughout to auscultation. Heart: Regular rate and rhythm, no murmur. Abdomen: Soft, nontender and nondistended. No masses or hepatomegaly. Normal bowel sounds x 4 quadrants.  Rectal: *** Musculoskeletal: Symmetrical with no gross deformities. Extremities: No edema. Neurological: Alert oriented x 4.  No focal deficits.  Psychological: Alert and cooperative. Normal mood and affect  Assessment and Recommendations:  1) Constipation   2) Elevated LFTs -IgG level, Hep B surface antibody level, GGT and hepatic panel

## 2020-12-29 ENCOUNTER — Ambulatory Visit: Payer: No Typology Code available for payment source | Admitting: Allergy & Immunology

## 2020-12-29 HISTORY — PX: OTHER SURGICAL HISTORY: SHX169

## 2021-01-31 ENCOUNTER — Other Ambulatory Visit: Payer: Self-pay

## 2021-01-31 ENCOUNTER — Encounter: Payer: Self-pay | Admitting: Allergy & Immunology

## 2021-01-31 ENCOUNTER — Ambulatory Visit (INDEPENDENT_AMBULATORY_CARE_PROVIDER_SITE_OTHER): Payer: No Typology Code available for payment source | Admitting: Allergy & Immunology

## 2021-01-31 VITALS — BP 116/80 | HR 98 | Temp 98.1°F | Resp 12 | Ht 61.0 in | Wt 114.4 lb

## 2021-01-31 DIAGNOSIS — R7989 Other specified abnormal findings of blood chemistry: Secondary | ICD-10-CM

## 2021-01-31 DIAGNOSIS — K9049 Malabsorption due to intolerance, not elsewhere classified: Secondary | ICD-10-CM | POA: Diagnosis not present

## 2021-01-31 DIAGNOSIS — J4520 Mild intermittent asthma, uncomplicated: Secondary | ICD-10-CM

## 2021-01-31 DIAGNOSIS — L508 Other urticaria: Secondary | ICD-10-CM

## 2021-01-31 NOTE — Progress Notes (Signed)
FOLLOW UP  Date of Service/Encounter:  01/31/21   Assessment:   Food intolerance - with multiple equivocal reactivities with unclear clinical significance   Chronic urticaria   Allergic asthma - well controlled (did not do environmental allergy testing today)   Elevated LFTs - getting repeat labs today to trend her enzyme levels  Plan/Recommendations:   1. Food intolerance (shrimp, gluten) - Continue to avoid these foods. - We are getting a repeat liver function test to make sure those have normalized.   2. Chronic urticaria  - This seems to be under good control.  3. Return in about 1 year (around 01/31/2022).   Subjective:   Tara Davies is a 21 y.o. female presenting today for follow up of  Chief Complaint  Patient presents with   Follow-up    For allergy testing. Still breaking out when eating shrimp and gluten.    Tara Davies has a history of the following: Patient Active Problem List   Diagnosis Date Noted   Mild intermittent asthma, uncomplicated 10/03/2020   Chronic urticaria 10/03/2020   Food intolerance 10/03/2020   Constipation 09/06/2020   Abdominal bloating 09/06/2020   Adolescent idiopathic scoliosis of thoracolumbar region 10/22/2014    History obtained from: chart review and patient.  Tara Davies is a 21 y.o. female presenting for a follow up visit.  She was last seen as a new patient in September 2022.  At that time, she had testing that was only slightly reactive to a few things.  This included cod, carrot, black pepper, lamb, and casein.  Since the last visit, she has done very well.   Asthma/Respiratory Symptom History: She has seasonal asthma that is well controlled. It does worsen with the pollen exposure. She did have RSV.  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 She seemed to get it the worst.   Food Allergy Symptom History: She has broken out in hives this past weekend from shrimp. She is just going to avoid it. She also  reports that she has a rash from gluten. This is all over her body. It seems to be itchy whelpts. These stick around for a short period of time, around 15 minutes with antihistamines. Otherwise they last around 1-2 hours.   She does eat ice cream without a problem. She has eaten carrots without a problem. She has had black pepper without a problem.   Skin Symptom History: She takes Benadryl with her. She has some problems eating out when she cannot eat gluten.   She never went back to see her GI doctor. She felt better after she took gluten out of her diet. She feels "gross" with gluten. This workup all started around the summer 2022. She just did not think that she needed to follow up. She is open to getting repeat labs today to trend these.   Otherwise, there have been no changes to her past medical history, surgical history, family history, or social history.    Review of Systems  Constitutional: Negative.  Negative for fever, malaise/fatigue and weight loss.  HENT: Negative.  Negative for congestion, ear discharge and ear pain.   Eyes:  Negative for pain, discharge and redness.  Respiratory:  Negative for cough, sputum production, shortness of breath and wheezing.   Cardiovascular: Negative.  Negative for chest pain and palpitations.  Gastrointestinal:  Negative for abdominal pain, heartburn, nausea and vomiting.  Skin:  Positive for itching and rash.  Neurological:  Negative for dizziness  and headaches.  Endo/Heme/Allergies:  Negative for environmental allergies. Does not bruise/bleed easily.      Objective:   Blood pressure 116/80, pulse 98, temperature 98.1 F (36.7 C), temperature source Temporal, resp. rate 12, height 5\' 1"  (1.549 m), weight 114 lb 6.4 oz (51.9 kg), SpO2 98 %. Body mass index is 21.62 kg/m.   Physical Exam:  Physical Exam Vitals reviewed.  Constitutional:      Appearance: She is well-developed.     Comments: Talkative.   HENT:     Head: Normocephalic  and atraumatic.     Right Ear: Tympanic membrane, ear canal and external ear normal.     Left Ear: Tympanic membrane, ear canal and external ear normal.     Nose: No nasal deformity, septal deviation, mucosal edema or rhinorrhea.     Right Turbinates: Not enlarged or swollen.     Left Turbinates: Not enlarged or swollen.     Right Sinus: No maxillary sinus tenderness or frontal sinus tenderness.     Left Sinus: No maxillary sinus tenderness or frontal sinus tenderness.     Mouth/Throat:     Mouth: Mucous membranes are not pale and not dry.     Pharynx: Uvula midline.  Eyes:     General: Lids are normal. No allergic shiner.       Right eye: No discharge.        Left eye: No discharge.     Conjunctiva/sclera: Conjunctivae normal.     Right eye: Right conjunctiva is not injected. No chemosis.    Left eye: Left conjunctiva is not injected. No chemosis.    Pupils: Pupils are equal, round, and reactive to light.  Cardiovascular:     Rate and Rhythm: Normal rate and regular rhythm.     Heart sounds: Normal heart sounds.  Pulmonary:     Effort: Pulmonary effort is normal. No tachypnea, accessory muscle usage or respiratory distress.     Breath sounds: Normal breath sounds. No wheezing, rhonchi or rales.  Chest:     Chest wall: No tenderness.  Lymphadenopathy:     Cervical: No cervical adenopathy.  Skin:    Coloration: Skin is not pale.     Findings: No abrasion, erythema, petechiae or rash. Rash is not papular, urticarial or vesicular.  Neurological:     Mental Status: She is alert.  Psychiatric:        Behavior: Behavior is cooperative.     Diagnostic studies: labs sent instead (LFTs)      , MD  [facility]

## 2021-01-31 NOTE — Patient Instructions (Addendum)
1. Food intolerance (shrimp, gluten) - Continue to avoid these foods. - We are getting a repeat liver function test to make sure those have normalized.   2. Chronic urticaria  - This seems to be under good control.  3. Return in about 1 year (around 01/31/2022).    Please inform us of any Emergency Department visits, hospitalizations, or changes in symptoms. Call us before going to the ED for breathing or allergy symptoms since we might be able to fit you in for a sick visit. Feel free to contact us anytime with any questions, problems, or concerns.  It was a pleasure to see you again!  Websites that have reliable patient information: 1. American Academy of Asthma, Allergy, and Immunology: www.aaaai.org 2. Food Allergy Research and Education (FARE): foodallergy.org 3. Mothers of Asthmatics: http://www.asthmacommunitynetwork.org 4. American College of Allergy, Asthma, and Immunology: www.acaai.org   COVID-19 Vaccine Information can be found at: ShippingScam.co.uk For questions related to vaccine distribution or appointments, please email [email or web address] or call [phone].   We realize that you might be concerned about having an allergic reaction to the COVID19 vaccines. To help with that concern, WE ARE OFFERING THE COVID19 VACCINES IN OUR OFFICE! Ask the front desk for dates!     Like [place] on [employer] and Instagram for our latest updates!      A healthy democracy works best when [employer] participate! Make sure you are registered to vote! If you have moved or changed any of your contact information, you will need to get this updated before voting!  In some cases, you MAY be able to register to vote online: [email or web address]

## 2021-02-01 LAB — HEPATIC FUNCTION PANEL
ALT: 57 IU/L — ABNORMAL HIGH (ref 0–32)
AST: 35 IU/L (ref 0–40)
Albumin: 5.1 g/dL — ABNORMAL HIGH (ref 3.9–5.0)
Alkaline Phosphatase: 79 IU/L (ref 44–121)
Bilirubin Total: 0.8 mg/dL (ref 0.0–1.2)
Bilirubin, Direct: 0.18 mg/dL (ref 0.00–0.40)
Total Protein: 7.7 g/dL (ref 6.0–8.5)

## 2021-04-18 ENCOUNTER — Ambulatory Visit: Payer: No Typology Code available for payment source | Admitting: Allergy & Immunology

## 2023-01-12 IMAGING — US US ABDOMEN LIMITED
1 series · 15 of 25 positions shown · non-contrast
Comparison: None.

CLINICAL DATA: Initial evaluation for elevated LFTs.

EXAM:
ULTRASOUND ABDOMEN LIMITED RIGHT UPPER QUADRANT

[Series 1: us abdomen limited ruq mc & wl · 15 of 45 slices shown]
[im 1/45]
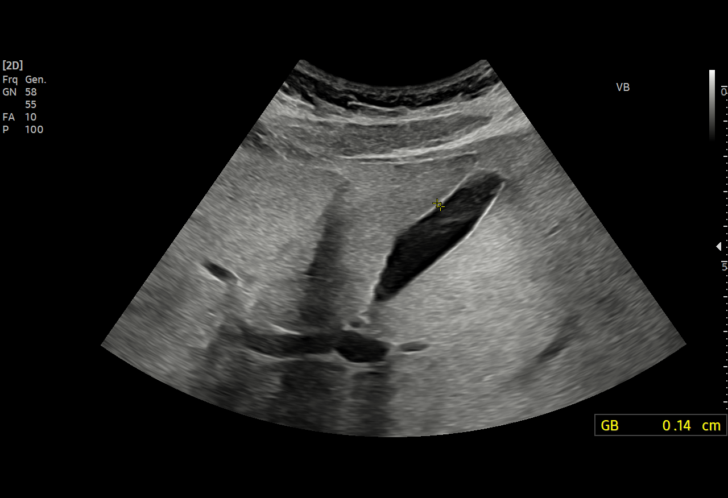
[im 4/45]
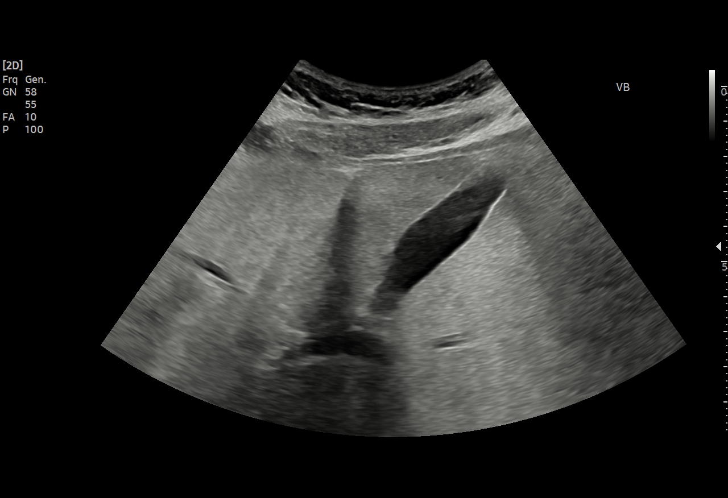
[im 8/45]
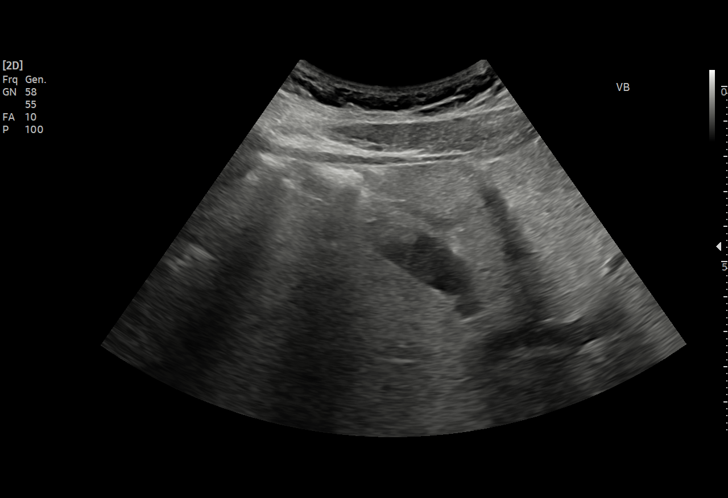
[im 10/45]
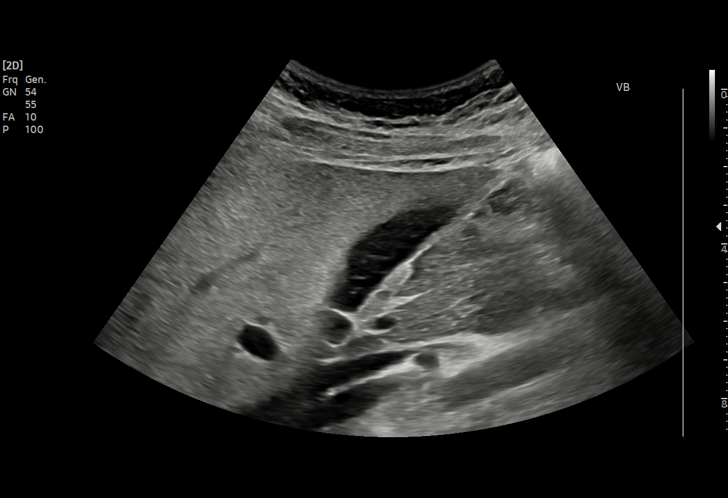
[im 13/45]
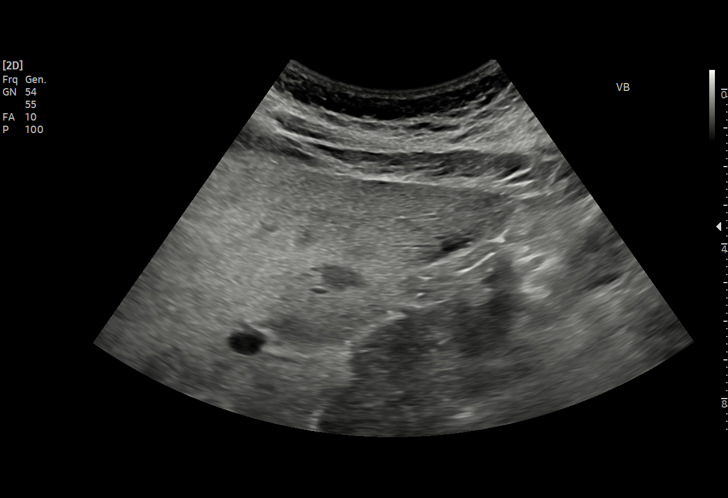
[im 17/45]
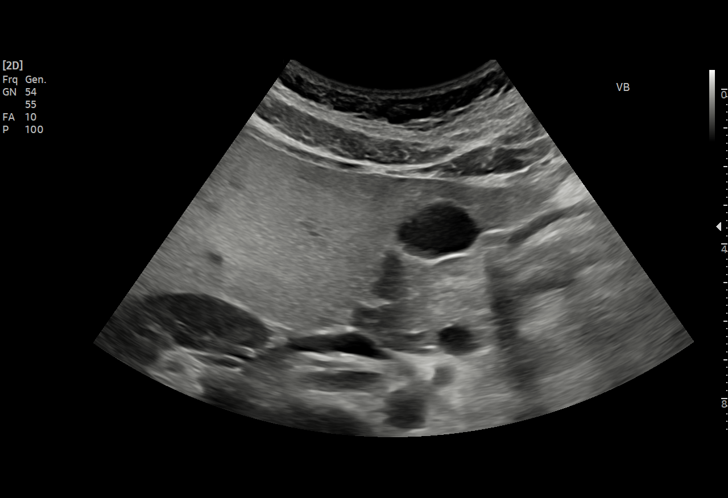
[im 19/45]
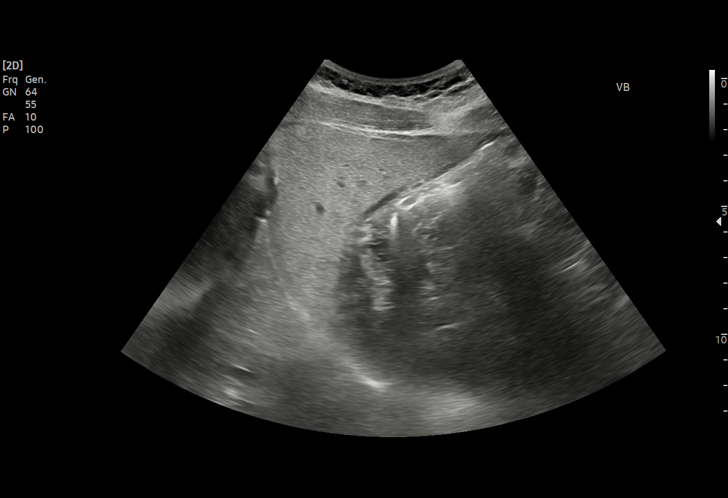
[im 23/45]
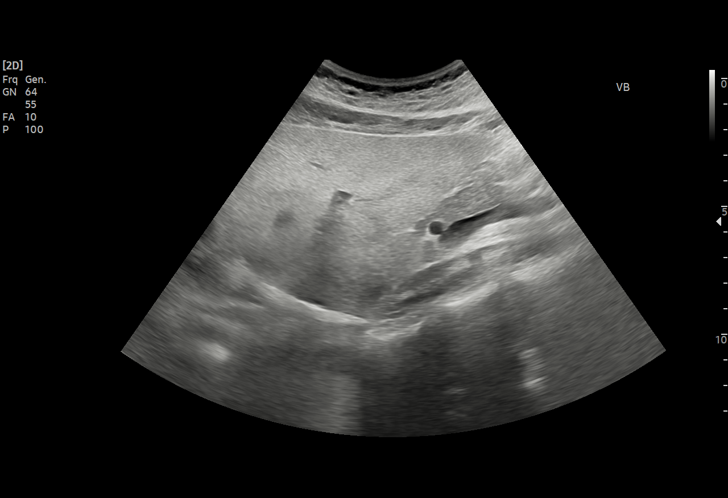
[im 26/45]
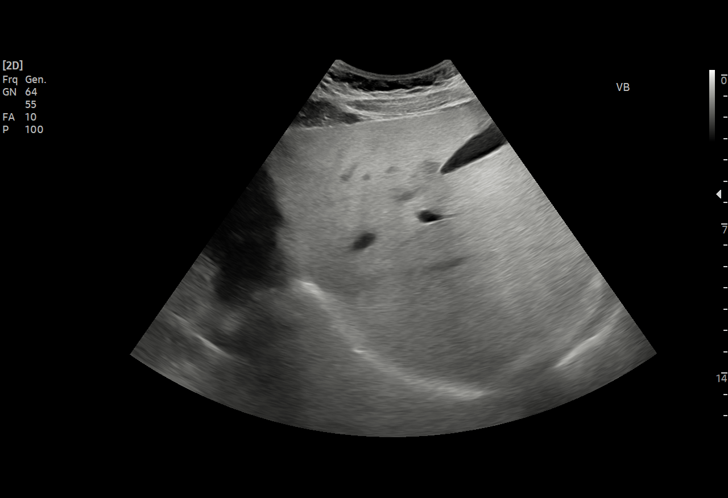
[im 28/45]
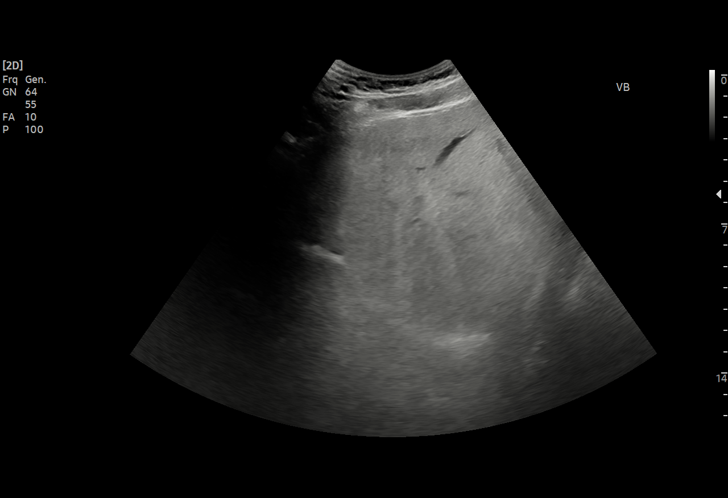
[im 32/45]
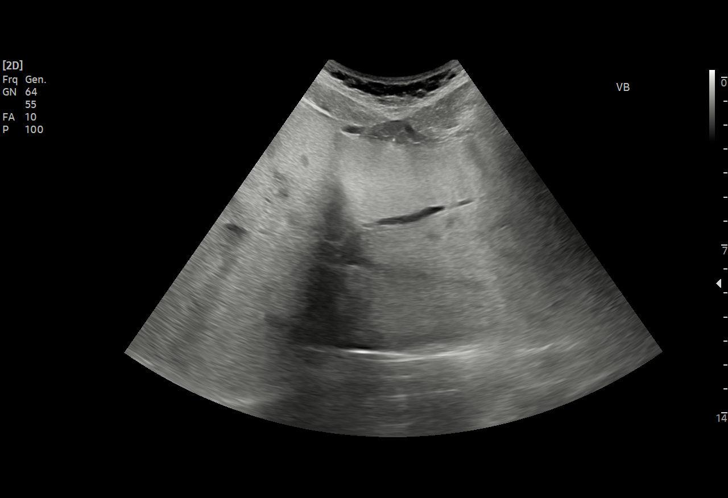
[im 35/45]
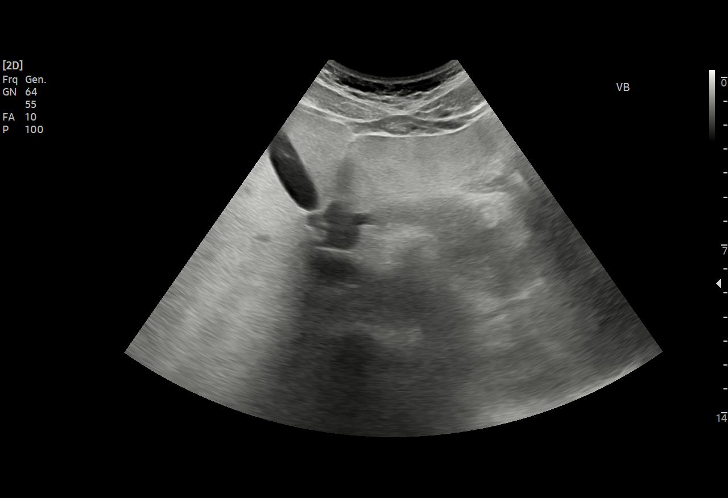
[im 37/45]
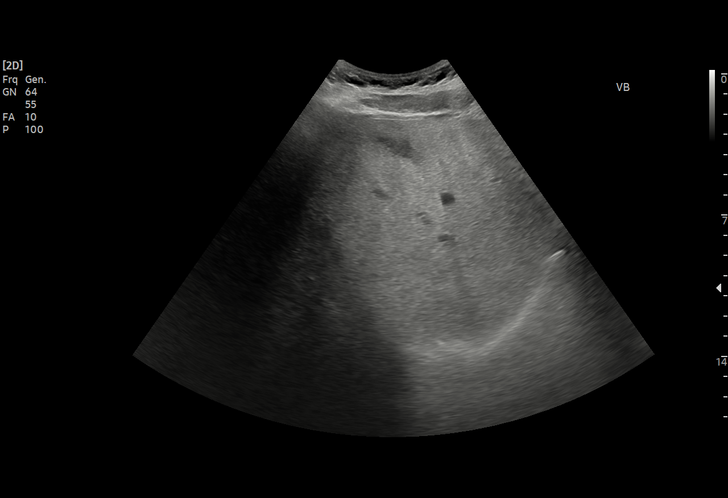
[im 41/45]
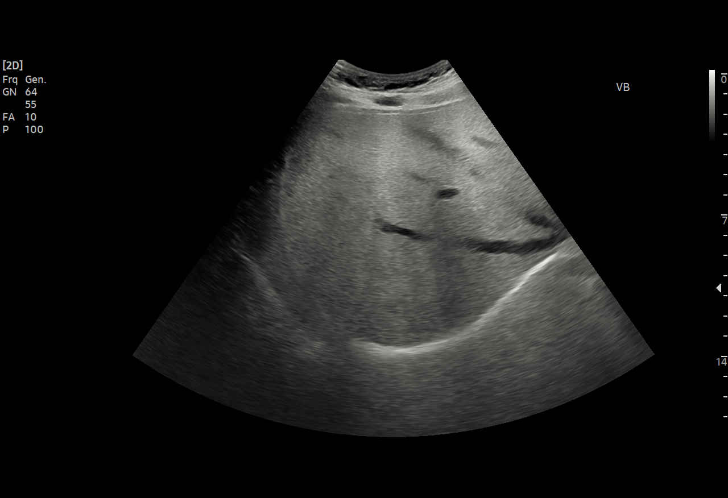
[im 45/45]
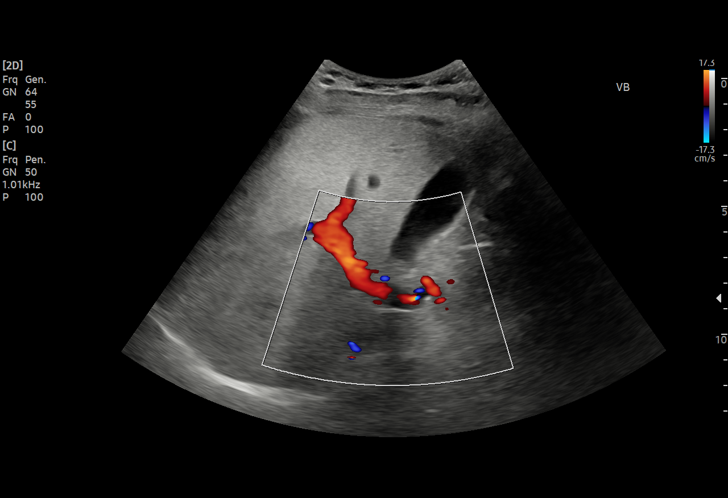

[15 of 25 positions shown; findings below may reference images not displayed]

FINDINGS: Gallbladder:

No gallstones or wall thickening visualized. No sonographic Murphy
sign noted by sonographer.

Common bile duct:

Diameter: 2.5 mm

Liver:

No focal lesion identified. Liver demonstrates a dense echogenic
echotexture. Portal vein is patent on color Doppler imaging with
normal direction of blood flow towards the liver.

Other: None.
IMPRESSION: 1. Normal sonographic appearance of the gallbladder. No biliary
dilatation.
2. Dense echogenic echotexture of the liver, suggesting steatosis.
